# Patient Record
Sex: Male | Born: 1975 | Race: White | Hispanic: Yes | Marital: Single | Smoking: Never smoker
Health system: Southern US, Community
[De-identification: ages and names within clinical notes are randomized; demographics above are authoritative.]

---

## 2009-03-18 ENCOUNTER — Emergency Department (HOSPITAL_COMMUNITY): Admission: EM | Admit: 2009-03-18 | Discharge: 2009-03-18 | Payer: Self-pay | Admitting: Emergency Medicine

## 2011-09-02 ENCOUNTER — Ambulatory Visit: Payer: Self-pay

## 2011-09-02 DIAGNOSIS — J40 Bronchitis, not specified as acute or chronic: Secondary | ICD-10-CM

## 2011-09-02 DIAGNOSIS — R05 Cough: Secondary | ICD-10-CM

## 2011-09-02 DIAGNOSIS — J370 Chronic laryngitis: Secondary | ICD-10-CM

## 2013-10-05 ENCOUNTER — Ambulatory Visit: Payer: Self-pay

## 2013-10-05 ENCOUNTER — Ambulatory Visit: Payer: Self-pay | Admitting: Family Medicine

## 2013-10-05 VITALS — BP 120/80 | HR 71 | Temp 98.9°F | Ht 65.0 in | Wt 152.0 lb

## 2013-10-05 DIAGNOSIS — M7710 Lateral epicondylitis, unspecified elbow: Secondary | ICD-10-CM

## 2013-10-05 DIAGNOSIS — M79609 Pain in unspecified limb: Secondary | ICD-10-CM

## 2013-10-05 DIAGNOSIS — M25529 Pain in unspecified elbow: Secondary | ICD-10-CM

## 2013-10-05 DIAGNOSIS — M25521 Pain in right elbow: Secondary | ICD-10-CM

## 2013-10-05 DIAGNOSIS — M7711 Lateral epicondylitis, right elbow: Secondary | ICD-10-CM

## 2013-10-05 DIAGNOSIS — M79672 Pain in left foot: Secondary | ICD-10-CM

## 2013-10-05 MED ORDER — DICLOFENAC SODIUM 75 MG PO TBEC: 75.0000 mg | DELAYED_RELEASE_TABLET | Freq: Two times a day (BID) | ORAL | Status: DC

## 2013-10-05 NOTE — Progress Notes (Signed)
Subjective: 38 year old Hispanic male who has been having pain in his right elbow for the last couple of weeks. 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 He requires continuous repetitive movement of his right elbow. No specific injury. It hurts when she grips hard and rotates his arm.  Also complains of some pain in left heel with forced dorsiflexion. Not tender.  Objective: Tender right lateral epicondyle region. The pain shoots down into the forearm. Forced pronation and supination make it hurt a lot worse. Neurovascular intact.  Assessment: Lateral epicondylitis right arm Heel pain  Plan: X-ray of elbow  [facility] reading (PRIMARY) by  Dr. Alwyn RenHopper Normal elbow  Will treat with NSAIDs. If not doing better over the next week or so we will inject it..Marland Kitchen

## 2013-10-05 NOTE — Patient Instructions (Addendum)
Epicondilitis lateral (codo de tenista) con rehabilitacin (Lateral Epicondylitis [Tennis Elbow] with Rehab) La epicondilitis lateral consiste en la inflamacin y dolor alrededor de la regin externa del codo. El dolor tiene su origen en la inflamacin de los tendones del antebrazo que extienden la [place]. La epicondilitis lateral, tambin es denominada codo de Bulgariatenista debido a que es muy frecuente The Krogerentre los jugadores de tenis. Sin embargo, Nurse, children'spuede suceder en cualquier individuo que extienda la mueca de [place]manera repetitiva. Si esta afeccin no se trata, puede transformarse en un problema crnico. SNTOMAS  Dolor y sensibilidad e inflamacin en la zona externa (lateral) del codo.  Dolor o debilidad al tomar TEPPCO Partnersobjetos.  Dolor que Golden West Financialaumenta con los movimientos de rotacin de la mueca (jugar al tenis, usar un Engineer, maintenancedestornillador, abrir una puerta o un frasco).  Dolor al levantar objetos, inclusive [place] taza de caf. CAUSAS  La causa de la epicondilitis lateral es la inflamacin de los tendones que extienden la [place]. Entre las causas se incluyen:  Librarian, academicstrs repetitivo y distensin de los msculos y los tendones que extienden la [place].  Cambio repentino en el nivel o intensidad de la [place] Stationactividad.  Agarre incorrecto en los deportes con raqueta.  Tamao incorrecto del puo de la raqueta (con frecuencia demasiado grande).  Posicin o tcnica incorrecta al PepsiCoefectuar un golpe (generalmente con el dorso de la mano; llevada por el codo).  Utilizar una raqueta demasiado pesada. LOS RIESGOS AUMENTAN CON:  Los deportes u ocupaciones que requieren movimientos repetitivos y extenuantes del antebrazo y la [place] (tenis, squash, deportes con raqueta, carpintera).  Poca fuerza y flexibilidad de la Turkmen[place]mueca y [place] antebrazo.  Precalentamiento y elongacin inadecuados antes de la Carn[place] Bayactividad.  Regreso a la actividad antes de Lehman Brotherscompletar la curacin, la rehabilitacin y Public relations account executiveel entrenamiento. PREVENCIN  Precalentamiento  adecuado y elongacin antes de la [place] Joyaactividad.  Mantener la forma fsica:  Earma ReadingFuerza, flexibilidad y resistencia muscular.  Capacidad cardiovascular.  Utilice un equipo que le ajuste adecuadamente.  Usar la tcnica correcta y Warehouse managertener un entrenador que corrija la tcnica incorrecta.  Use un vendaje para el codo apropiado para el tenis (contrafuerza). PRONSTICO El curso de la enfermedad depende del grado de la lesin. Si se trata adecuadamente, los casos agudos (sntomas que duran menos de 4 semanas) generalmente se resuelven en un perodo de 2 a 6 semanas. Los casos crnicos (que duran ms tiempo) se resuelven en un lapso de 3 a 6 meses, pero pueden requerir de tratamiento fisioteraputico. COMPLICACIONES RELACIONADAS  La recurrencia frecuente de los sntomas puede dar como resultado un problema crnico. Un tratamiento adecuado en su inicio disminuye la probabilidad de recurrencias.  La inflamacin crnica, degeneracin cicatrizal del tendn y ruptura parcial del tendn, requieren Azerbaijanciruga.  Demora de la curacin o de la resolucin de los sntomas. TRATAMIENTO El tratamiento inicial consiste en la toma de medicamentos y la aplicacin de hielo para Engineer, materialsaliviar el dolor y reducir la hinchazn. Los ejercicios de elongacin y fortalecimiento pueden ayudar a reducir las molestias si se realizan con regularidad. Podr realizar estos ejercicios en su casa, si se trata de una afeccin aguda. Los casos crnicos pueden requerir la derivacin a un fisioterapeuta para Magazine features editorrealizar una evaluacin y Dentistcomenzar un tratamiento. Su mdico podr indicarle inyecciones con corticoides para reducir la inflamacin. Raras veces es necesario someterse a [place] and Herz[place]ovinauna ciruga. MEDICAMENTOS   Si es necesaria la administracin de medicamentos para Chief Technology Officerel dolor, se recomiendan los antiinflamatorios no esteroides, (aspirina e ibuprofeno) u otros calmantes menores (acetaminofeno).  No tome medicamentos para el  dolor dentro de los [address] previos a la  [place].  El profesional podr prescribirle calmantes si lo considera necesario. Utilcelos como se le indique y slo cuando lo necesite.  Se podrn recomendar inyecciones de corticoesteorides. Estas inyecciones deben reservarse para los [place] graves, porque slo se pueden administrar una determinada cantidad de veces. CALOR Y FRO  El fro debe aplicarse durante 10 a 15 minutos cada 2  3 horas para reducir la inflamacin y Chief Technology Officer e inmediatamente despus de cualquier actividad que agrava los sntomas. Utilice bolsas o un masaje de hielo.  El calor puede usarse antes de Therapist, music y de las actividades de fortalecimiento indicadas por el profesional, le fisioterapeuta o Orthoptist. Utilice una bolsa trmica o un pao hmedo. SOLICITE ATENCIN MDICA SI: Los sntomas empeoran o no mejoran en 2 semanas, a pesar de Medical illustrator. EJERCICIOS EJERCICIOS DE AMPLITUD DE MOVIMIENTOS Y ELONGACIN  Epicondilitis lateral (codo de [place]) Estos ejercicios le ayudarn en la recuperacin de la lesin. Los sntomas podrn desaparecer con o sin mayor intervencin del profesional, el fisioterapeuta o Orthoptist. Al completar estos ejercicios, recuerde:   Restaurar la flexibilidad del tejido ayuda a que las articulaciones recuperen el movimiento normal. Esto permite que el movimiento y la actividad sea ms saludables y menos dolorosos.  Para que sea efectiva, cada elongacin debe realizarse durante al menos 30 segundos.  La elongacin nunca debe ser dolorosa. Deber sentir slo un alargamiento suave o elongacin del tejido que estira. AMPLITUD DE MOVIMIENTOS  Flexin de la [place], asistida  Extienda el codo derecha / izquierdo con los dedos apuntando [place] abajo.*  Tire suavemente la palma de la mano hacia usted, hasta que sienta un ligero estiramiento de la parte superior del [place].  Mantenga esta posicin durante __________ segundos. Reptalo __________ veces. Realice este  estiramiento __________ Anthoney Harada por da.  *Realice este ejercicio con el codo flexionado en lugar de extendido si el mdico, fisioterapeuta o entrenador se lo indican. AMPLITUD DE MOVIMIENTOS  Flexin de la [place], asistida  Extienda el codo derecha / izquierdo con la palma [place] arriba.*  Tire suavemente la palma y la punta de los dedos [place] atrs, para que la [place] se extienda y los dedos apunten hacia el suelo.  Debe sentir un ligero estiramiento en la parte interior del antebrazo.  Mantenga esta posicin durante __________segundos. Reptalo __________ veces. Realice este estiramiento __________ Anthoney Harada por da. *Realice este ejercicio con el codo flexionado en lugar de extendido si el mdico, fisioterapeuta o entrenador se lo indican. FUERZA  Flexin de la [employer] la palma de la mano derecha / izquierdo plana sobre una mesa, con el codo ligeramente doblado. Los dedos deben apuntar hacia el lado contrario del cuerpo.  Presione suavamente la parte de atrs de la mano en la mesa y enderece el codo. Debe sentir un ligero estiramiento en la parte superior del antebrazo.  Mantenga esta posicin durante __________ segundos. Reptalo __________ veces. Realice este estiramiento __________ Anthoney Harada por da.  FUERZA  Extensin de la [employer] puntas de los dedos de la mano derecha / izquierdo plana sobre una mesa, con el codo ligeramente doblado. Los dedos deben apuntar [employer].  Presione suavamente los dedos y la mano en la mesa y enderece el codo. Debe sentir un ligero estiramiento en la parte interna del antebrazo.  Mantenga esta posicin durante __________ segundos. Reptalo __________ veces. Realice este estiramiento __________ Anthoney Harada por da.  EJERCICIOS DE FORTALECIMIENTO  Epicondilitis lateral (codo  de [place]) Estos ejercicios le ayudarn en la recuperacin de la lesin. Los sntomas podrn desaparecer con o sin mayor intervencin del profesional, el fisioterapeuta o  Orthoptist. Al completar estos ejercicios, recuerde:   Los msculos pueden ganar tanto la resistencia como la fuerza que necesita para sus actividades diarias a travs de ejercicios controlados.  Realice los ejercicios como se lo indic el mdico, el fisioterapeuta o Orthoptist. Aumente la resistencia y repeticiones segn se le haya indicado.  Podr experimentar dolor o cansancio muscular, pero el dolor o molestia que trata de eliminar a travs de los ejercicios nunca debe empeorar. Si el dolor empeora, detngase y asegrese de que est siguiendo las directivas correctamente. Si an siente dolor luego de Education officer, environmental lo ajustes necesarios, deber discontinuar el ejercicio hasta que pueda conversar con el profesional sobre el problema. FUERZA Flexores de la mueca  Sintese con el antebrazo derecha / izquierdo con la palma hacia arriba y completamente apoyado sobre una mesa o [place]. El codo Scientist, research (physical sciences) en reposo y a la altura de los hombros. Haga que la [place] se extienda sobre los extremos de la superficie.  Sostenga sin apretar un peso de __________, Cheron Schaumann goma o tubo para ejercicios en ambas manos, y doble lentamente la mano hacia el [place].  Mantenga esta posicin durante __________ segundos. Baje lentamente la [employer] posicin inicial, de forma controlada. Reptalo __________ veces. Realice este estiramiento __________ Anthoney Harada por da.  FUERZA  Extensores de la mueca  Sintese con el antebrazo derecha / izquierdo con la palma hacia abajo y completamente apoyado sobre una mesa o [place]. El codo Scientist, research (physical sciences) en reposo y a la altura de los hombros. Haga que la [place] se extienda sobre los extremos de la superficie.  Sostenga sin apretar un peso de __________, Cheron Schaumann goma o tubo para ejercicios en ambas manos, y doble lentamente la mano hacia el [place].  Mantenga esta posicin durante __________ segundos. Baje lentamente la [employer] posicin inicial, de forma  controlada. Reptalo __________ veces. Realice este estiramiento __________ Anthoney Harada por da.  FUERZA  Desviacin ulnar  Prese sosteniendo un peso de ____________________ en su mano derecha / izquierdo, o sintese sosteniendo una banda de goma para ejercicios, con el brazo sano apoyado en una mesa o mesada.  Mueva la [place], para que el dedo meique apunte hacia el antebrazo y Multimedia programmer en contra del mismo.  Mantenga esta posicin durante __________ segundos y luego lentamente baje la mueca a la posicin inicial. Reptalo __________ veces. Realice este ejercicio __________ veces por da. FUERZA  Desviacin radial  Prese sosteniendo un peso de ____________________ en su mano derecha / izquierdo, o sintese sosteniendo una banda de goma para ejercicios, con el brazo lesionado apoyado en una mesa o [place].  Eleve la mano hacia arriba, por delante suyo o tire [place] arriba la banda de [place].  Mantenga esta posicin durante __________ segundos y luego lentamente baje la mueca a la posicin inicial. Reptalo __________ veces. Realice este estiramiento __________ Anthoney Harada por da. FUERZA  Supinadores del antebrazo  Sintese con su antebrazo derecha / izquierdo apoyado sobre una mesa, manteniendo el codo por debajo de la altura del hombro. Apoye la [employer] borde, con la palma [place].  Suavemente tome un martillo o un cucharn de sopa.  Sin mover el codo, gire lentamente la palma y la mano hacia arriba para colocar el "pulgar arriba".  Mantenga esta posicin durante __________ segundos. Vuelva lentamente a la posicin  inicial. Reptalo __________ veces. Realice este estiramiento __________ Anthoney Harada por da.  FUERZA  Pronadores del antebrazo  Sintese con su antebrazo derecha / izquierdo apoyado sobre una mesa, manteniendo el codo por debajo de la altura del hombro. Apoye la [employer] borde, con la palma [place].  Suavemente tome un martillo o un cucharn de sopa.  Sin mover el  codo, gire lentamente la palma y la mano hacia arriba para colocar el "pulgar arriba".  Mantenga esta posicin durante __________ segundos. Vuelva lentamente a la posicin inicial. Reptalo __________ veces. Realice este estiramiento __________ Anthoney Harada por da.  FUERZA  Agarre  [employer] pelota de tenis, una esponja dura o una media larga y [place].  Apritela lo ms fuerte que pueda, sin Tax inspector.  Mantenga esta posicin durante __________ segundos. Sultela lentamente. Reptalo __________ veces. Realice este estiramiento __________ Anthoney Harada por da.  FUERZA  Extensores del codo, isomtrica  Prese o sintese erguido sobre una superficie firme. Coloque su brazo Sales executive / izquierdo de modo que la palma de su mano quede frente al [place] y a la altura de su cintura.  Coloque la mano opuesta sobre lado inferior del [place]. Empuje suavemente hacia arriba mientras su brazo derecha / izquierdo opone resistencia. Empuje tan intensamente como pueda con ambos brazos sin causar Scientist, research (medical) ni realizar movimientos con su codo derecha / izquierdo. Mantenga esta posicin durante __________ segundos. Libere la tensin de ambos brazos gradualmente. Permita que sus msculos se relajen completamente antes de repetir. Document Released: 05/25/2006 Document Revised: 10/31/2011 [facility] Patient Information 2014 [place], [place].   If not doing better over the next week or 10 days return we'll inject with cortisone.  Take diclofenac one twice daily  Try and rest the elbow and you should left arm.  Apply ice for 15 or 20 minutes for 4-5 times daily to elbow  Purchased an over-the-counter elbow band which can help reduce the irritation and pain

## 2013-10-25 ENCOUNTER — Ambulatory Visit: Payer: Self-pay | Admitting: Family Medicine

## 2013-10-25 VITALS — BP 122/78 | HR 77 | Temp 97.7°F | Resp 16 | Ht 65.0 in | Wt 156.0 lb

## 2013-10-25 DIAGNOSIS — M7710 Lateral epicondylitis, unspecified elbow: Secondary | ICD-10-CM

## 2013-10-25 DIAGNOSIS — M7711 Lateral epicondylitis, right elbow: Secondary | ICD-10-CM

## 2013-10-25 DIAGNOSIS — M25529 Pain in unspecified elbow: Secondary | ICD-10-CM

## 2013-10-25 DIAGNOSIS — T7840XA Allergy, unspecified, initial encounter: Secondary | ICD-10-CM

## 2013-10-25 DIAGNOSIS — M25521 Pain in right elbow: Secondary | ICD-10-CM

## 2013-10-25 DIAGNOSIS — M79609 Pain in unspecified limb: Secondary | ICD-10-CM

## 2013-10-25 DIAGNOSIS — M79672 Pain in left foot: Secondary | ICD-10-CM

## 2013-10-25 MED ORDER — DICLOFENAC SODIUM 75 MG PO TBEC: 75.0000 mg | DELAYED_RELEASE_TABLET | Freq: Two times a day (BID) | ORAL | Status: AC

## 2013-10-25 MED ORDER — FLUTICASONE PROPIONATE 50 MCG/ACT NA SUSP: 2.0000 | Freq: Every day | NASAL | Status: AC

## 2013-10-25 MED ORDER — NAPHAZOLINE HCL 0.1 % OP SOLN: 1.0000 [drp] | Freq: Four times a day (QID) | OPHTHALMIC | Status: AC | PRN

## 2013-10-25 NOTE — Patient Instructions (Signed)
Take over-the-counter Claritin (loratadine) one daily for allergy  Use the fluticasone nose spray 2 sprays each side of the nose once daily  Use the eyedrops 1 drop in each a 3 or 4 times daily as needed  Continue taking the diclofenac for the elbow and foot  Tonight put ice on the elbow for about 15 minutes 2 or 3 times, and again tomorrow.  Return if worse or not doing better

## 2013-10-25 NOTE — Progress Notes (Signed)
Subjective: Patient is here with history of having respiratory infection problems over the last week. His eyes have been matted been having a lot of debris in the mornings. His nose is been congested. His throat is sore. He has chronic hoarseness anyhow. His right elbow calmed down a little bit but with diclofenac, but has not gotten well. The heel is doing better.  Objective: Eyes are mildly injected. Fundi benign. Nose congested. Throat clear. Neck supple without significant nodes. Chest clear. Heart regular without murmurs. Right elbow still moderately tender over the lateral epicondyle. Discussed treatment of injections. He agreed to this.  Procedure note. Routine sterile technique. Betadine and alcohol and ethyl chloride. 15 mg of Kenalog plus and equal amount of 2% lidocaine were injected. Patient tolerated this well. No complications.  Assessment: Allergic rhinitis and conjunctivitis Lateral condyleitis  Plan: See instructions

## 2015-02-23 IMAGING — CR DG ELBOW 2V*R*
3 series · 3 of 3 positions shown · non-contrast
Comparison: None.

CLINICAL DATA: Elbow pain

EXAM:
RIGHT ELBOW - 2 VIEW

[AP]
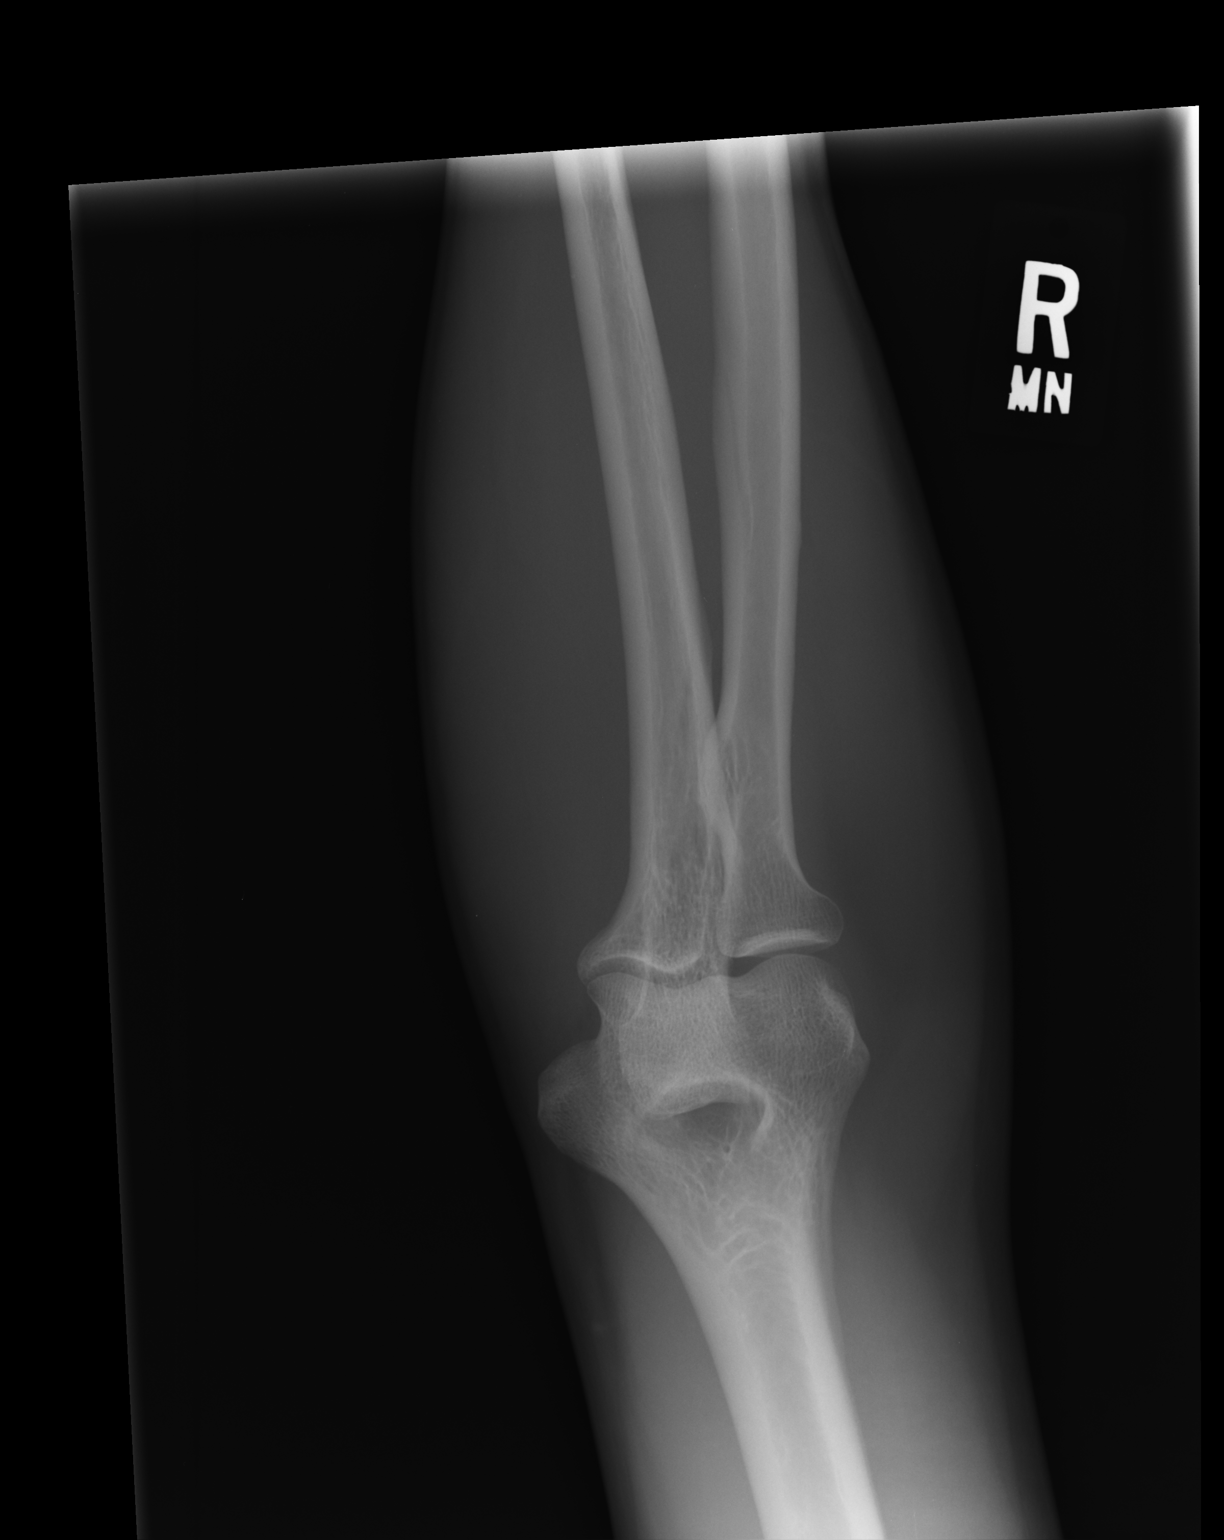

[lateral (1 of 2)]
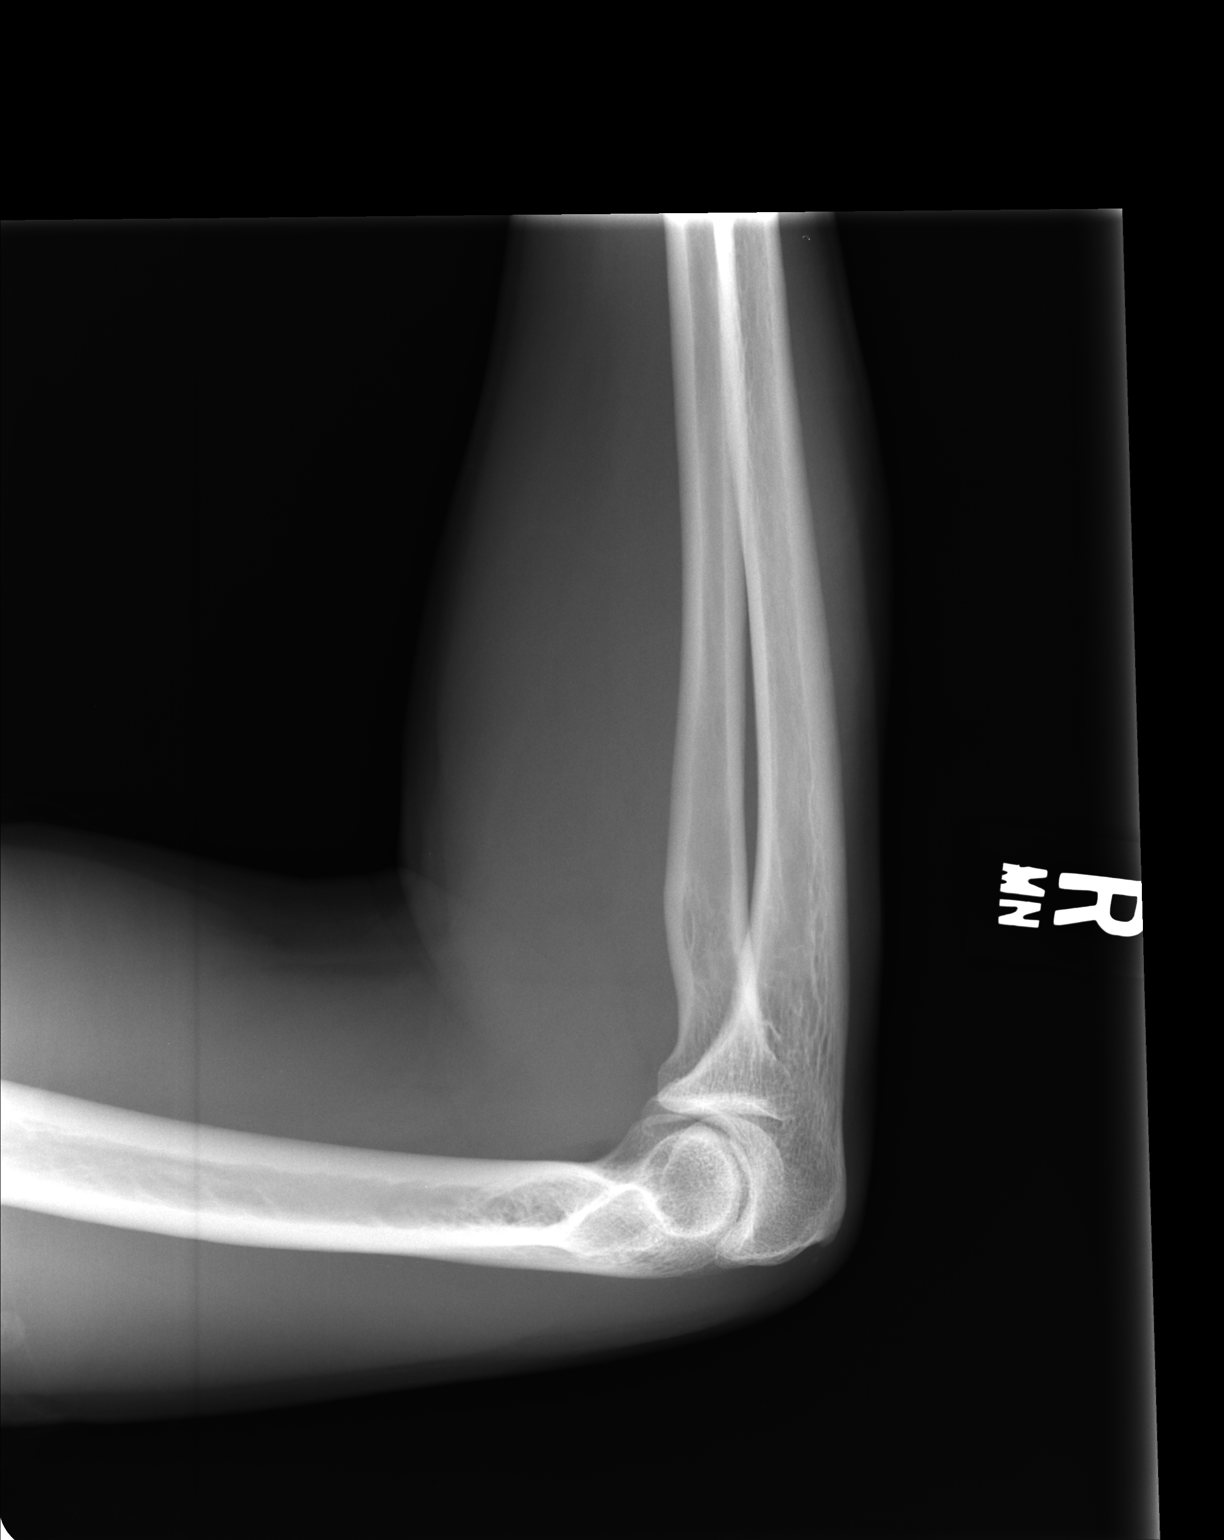

[lateral (2 of 2)]
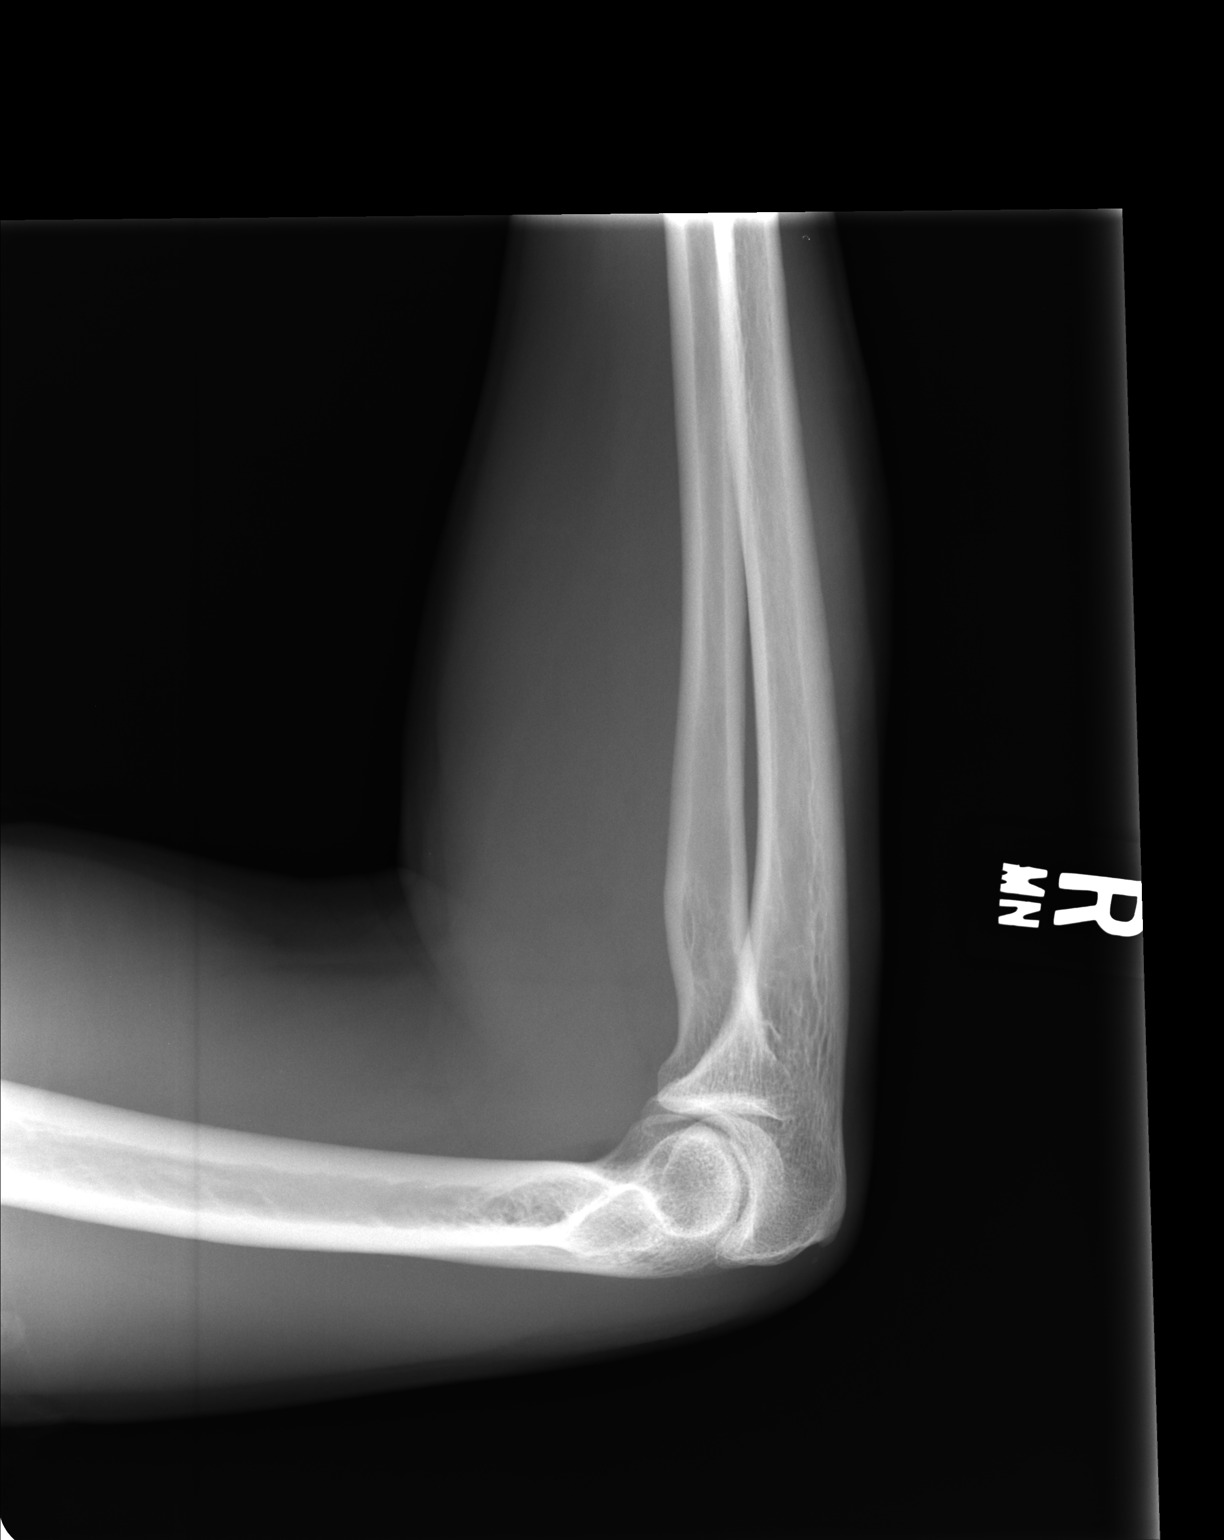

[3 of 3 positions shown; findings below may reference images not displayed]

FINDINGS: No displaced fracture or elbow joint effusion. Joint spaces appear
preserved. Regional soft tissues appear normal.
IMPRESSION: Normal radiographs of the right elbow.

## 2017-11-09 ENCOUNTER — Ambulatory Visit: Payer: Self-pay | Admitting: Physician Assistant

## 2017-11-15 ENCOUNTER — Telehealth: Payer: Self-pay | Admitting: Physician Assistant

## 2017-11-15 NOTE — Telephone Encounter (Signed)
Called pt to try and reschedule his missed appt from 11/09/17. I did not leave a VM - no number on DPR. If pt calls in, please reschedule him with Benjiman CoreBrittany Wiseman at his convenience.   Thanks!
